# Patient Record
Sex: Male | Born: 2000
Health system: Southern US, Community
[De-identification: ages and names within clinical notes are randomized; demographics above are authoritative.]

## PROBLEM LIST (undated history)

## (undated) DIAGNOSIS — F419 Anxiety disorder, unspecified: Secondary | ICD-10-CM

## (undated) HISTORY — PX: TYMPANOSTOMY TUBE PLACEMENT: SHX32

## (undated) HISTORY — DX: Anxiety disorder, unspecified: F41.9

## (undated) HISTORY — PX: OTHER SURGICAL HISTORY: SHX169

---
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
## 2021-01-21 ENCOUNTER — Encounter: Payer: Self-pay | Admitting: Pediatrics

## 2021-01-21 ENCOUNTER — Ambulatory Visit (INDEPENDENT_AMBULATORY_CARE_PROVIDER_SITE_OTHER): Payer: 59 | Admitting: Pediatrics

## 2021-01-21 ENCOUNTER — Other Ambulatory Visit: Payer: Self-pay

## 2021-01-21 VITALS — BP 114/74 | Ht 70.5 in | Wt 222.7 lb

## 2021-01-21 DIAGNOSIS — Z68.41 Body mass index (BMI) pediatric, 5th percentile to less than 85th percentile for age: Secondary | ICD-10-CM | POA: Diagnosis not present

## 2021-01-21 DIAGNOSIS — R200 Anesthesia of skin: Secondary | ICD-10-CM

## 2021-01-21 DIAGNOSIS — Z23 Encounter for immunization: Secondary | ICD-10-CM | POA: Diagnosis not present

## 2021-01-21 DIAGNOSIS — Z0001 Encounter for general adult medical examination with abnormal findings: Secondary | ICD-10-CM

## 2021-01-21 DIAGNOSIS — R202 Paresthesia of skin: Secondary | ICD-10-CM

## 2021-01-21 DIAGNOSIS — G629 Polyneuropathy, unspecified: Secondary | ICD-10-CM | POA: Diagnosis not present

## 2021-01-21 DIAGNOSIS — Z00129 Encounter for routine child health examination without abnormal findings: Secondary | ICD-10-CM

## 2021-01-21 NOTE — Progress Notes (Signed)
Adolescent Well Care Visit Shane Cross is a 20 y.o. male who is here for well care.    PCP:  Georgiann Hahn, MD   History was provided by the patient and father.  Confidentiality was discussed with the patient and, if applicable, with caregiver as well.    Current Issues: Current concerns include none---has appointment with neurology for evaluation of numbness to legs--labs were normal..   Nutrition: Nutrition/Eating Behaviors: good Adequate calcium in diet?: yes Supplements/ Vitamins: n/a  Exercise/ Media: Play any Sports?/ Exercise: yes Screen Time:  < 2 hours Media Rules or Monitoring?: yes  Sleep:  Sleep: good  Social Screening: Lives with:  parents Parental relations:  good Activities, Work, and Regulatory affairs officer?: yes Concerns regarding behavior with peers?  no Stressors of note: no   Confidential Social History: Tobacco?  no Secondhand smoke exposure?  no Drugs/ETOH?  no  Sexually Active?  no   Pregnancy Prevention: n/a  Safe at home, in school & in relationships?  Yes Safe to self?  Yes   Screenings: Patient has a dental home: yes  The following topics were discussed: healthy eating, exercise, seatbelt use, bullying, abuse/trauma, weapon use, tobacco use, marijuana use, drug use, condom use, birth control, sexuality, suicidality/self harm, mental health issues, social isolation, school problems, family problems, and screen time   In addition, the following topics were discussed as part of anticipatory guidance   PHQ-9 completed and results indicated no risk  Physical Exam:  Vitals:   01/21/21 1030  BP: 114/74  Weight: 222 lb 11.2 oz (101 kg)  Height: 5' 10.5" (1.791 m)   BP 114/74   Ht 5' 10.5" (1.791 m)   Wt 222 lb 11.2 oz (101 kg)   BMI 31.50 kg/m  Body mass index: body mass index is 31.5 kg/m. Blood pressure percentiles are not available for patients who are 18 years or older.  Hearing Screening   500Hz  1000Hz  2000Hz  3000Hz  4000Hz   Right ear  25 25 25 25 25   Left ear 20 20 20 20 20    Vision Screening   Right eye Left eye Both eyes  Without correction 10/10 10/10   With correction       General Appearance:   alert, oriented, no acute distress and well nourished  HENT: Normocephalic, no obvious abnormality, conjunctiva clear  Mouth:   Normal appearing teeth, no obvious discoloration, dental caries, or dental caps  Neck:   Supple; thyroid: no enlargement, symmetric, no tenderness/mass/nodules  Chest normal  Lungs:   Clear to auscultation bilaterally, normal work of breathing  Heart:   Regular rate and rhythm, S1 and S2 normal, no murmurs;   Abdomen:   Soft, non-tender, no mass, or organomegaly  GU normal male genitals, no testicular masses or hernia  Musculoskeletal:   Tone and strength strong and symmetrical, all extremities               Lymphatic:   No cervical adenopathy  Skin/Hair/Nails:   Skin warm, dry and intact, no rashes, no bruises or petechiae  Neurologic:   Strength, gait, and coordination normal and age-appropriate     Assessment and Plan:   Well physical exam  BMI is appropriate for age  Hearing screening result:normal Vision screening result: normal  Counseling provided for all of the vaccine components  Orders Placed This Encounter  Procedures   Flu Vaccine QUAD 6+ mos PF IM (Fluarix Quad PF)     Return in about 1 year (around 01/21/2022).  , MD

## 2021-01-21 NOTE — Patient Instructions (Signed)
Preventive Care 18-19 Years Old, Male Preventive care refers to lifestyle choices and visits with your health care provider that can promote health and wellness. At this stage in your life, you may start seeing a primary care physician instead of a pediatrician. It is important to take responsibility for your health and well-being. Preventive care for young adults includes: A yearly physical exam. This is also called an annual wellness visit. Regular dental and eye exams. Immunizations. Screening for certain conditions. Healthy lifestyle choices, such as: Eating a healthy diet. Getting regular exercise. Not using drugs or products that contain nicotine and tobacco. Limiting alcohol use. What can I expect for my preventive care visit? Physical exam Your health care provider may check your: Height and weight. These may be used to calculate your BMI (body mass index). BMI is a measurement that tells if you are at a healthy weight. Heart rate and blood pressure. Body temperature. Skin for abnormal spots. Counseling Your health care provider may ask you questions about your: Past medical problems. Family's medical history. Alcohol, tobacco, and drug use. Home life and relationship well-being. Access to firearms. Emotional well-being. Diet, exercise, and sleep habits. Sexual activity and sexual health. What immunizations do I need? Vaccines are usually given at various ages, according to a schedule. Your health care provider will recommend vaccines for you based on your age, medical history, and lifestyle or other factors, such as travel or where you work. What tests do I need? Blood tests Lipid and cholesterol levels. These may be checked every 5 years starting at age 20. Hepatitis C test. Hepatitis B test. Screening Genital exam to check for testicular cancer or hernias. STD (sexually transmitted disease) testing, if you are at risk. Other tests Tuberculosis skin test. Vision and  hearing tests. Skin exam. Talk with your health care provider about your test results, treatment options, and if necessary, the need for more tests. Follow these instructions at home: Eating and drinking  Eat a healthy diet that includes fresh fruits and vegetables, whole grains, lean protein, and low-fat dairy products. Drink enough fluid to keep your urine pale yellow. Do not drink alcohol if: Your health care provider tells you not to drink. You are under the legal drinking age. In the [place] the legal drinking age is 21. If you drink alcohol: Limit how much you use to 0-2 drinks a day. Be aware of how much alcohol is in your drink. In the [place] one drink equals one 12 oz bottle of beer (355 mL), one 5 oz glass of wine (148 mL), or one 1 oz glass of hard liquor (44 mL). Lifestyle Take daily care of your teeth and gums. Brush your teeth every morning and night with fluoride toothpaste. Floss one time each day. Stay active. Exercise for at least 30 minutes 5 or more days of the week. Do not use any products that contain nicotine or tobacco, such as cigarettes, e-cigarettes, and chewing tobacco. If you need help quitting, ask your health care provider. Do not use drugs. If you are sexually active, practice safe sex. Use a condom or other form of protection to prevent STIs (sexually transmitted infections). Find healthy ways to cope with stress, such as: Meditation, yoga, or listening to music. Journaling. Talking to a trusted person. Spending time with friends and family. Safety Always wear your seat belt while driving or riding in a vehicle. Do not drive: If you have been drinking alcohol. Do not ride with someone who has been drinking.   When you are tired or distracted. While texting. Wear a helmet and other protective equipment during sports activities. If you have firearms in your house, make sure you follow all gun safety procedures. Seek help if you have been bullied, physically  abused, or sexually abused. Use the Internet responsibly to avoid dangers, such as online bullying and online sex predators. What's next? Go to your health care provider once a year for an annual wellness visit. Ask your health care provider how often you should have your eyes and teeth checked. Stay up to date on all vaccines. This information is not intended to replace advice given to you by your health care provider. Make sure you discuss any questions you have with your health care provider. Document Revised: 06/28/2020 Document Reviewed: 04/14/2018 Elsevier Patient Education  2022 Elsevier Inc.  

## 2021-02-13 ENCOUNTER — Telehealth: Payer: Self-pay | Admitting: Pediatrics

## 2021-02-13 DIAGNOSIS — R079 Chest pain, unspecified: Secondary | ICD-10-CM

## 2021-02-13 NOTE — Telephone Encounter (Signed)
Chest X ray ordered and will see him tomorrow with results

## 2021-02-13 NOTE — Telephone Encounter (Signed)
Father called stating patient started have left side chest pains yesterday and today both sides hurt. Patient is not having trouble breathing, no fever, no cough or congestion present. Patient complains when he takes a deep breath or sitting or moving around. Per Dr. Barney Drain would like patient go to get an chest x-ray tomorrow at [facility] [place] [facility] and then come to the office afterwards to be seen. Dad agrees with plan.

## 2021-02-14 ENCOUNTER — Ambulatory Visit (INDEPENDENT_AMBULATORY_CARE_PROVIDER_SITE_OTHER): Payer: 59 | Admitting: Pediatrics

## 2021-02-14 ENCOUNTER — Ambulatory Visit
Admission: RE | Admit: 2021-02-14 | Discharge: 2021-02-14 | Disposition: A | Discharge status: Home / Self Care | Payer: 59 | Source: Ambulatory Visit | Attending: Pediatrics | Admitting: Pediatrics

## 2021-02-14 ENCOUNTER — Other Ambulatory Visit: Payer: Self-pay

## 2021-02-14 VITALS — BP 124/84 | HR 96 | Wt 229.9 lb

## 2021-02-14 DIAGNOSIS — R079 Chest pain, unspecified: Secondary | ICD-10-CM

## 2021-02-14 DIAGNOSIS — R0789 Other chest pain: Secondary | ICD-10-CM

## 2021-02-14 MED ORDER — TRIAMCINOLONE ACETONIDE 0.025 % EX OINT: 1.0000 "application " | TOPICAL_OINTMENT | Freq: Two times a day (BID) | CUTANEOUS | 3 refills | Status: AC

## 2021-02-14 NOTE — Patient Instructions (Signed)
Costochondritis Costochondritis is irritation and swelling (inflammation) of the tissue that connects the ribs to the breastbone (sternum). This tissue is called cartilage. Costochondritis causes pain in the front of the chest. Usually, the pain: Starts slowly. Is in more than one rib. What are the causes? The exact cause of this condition is not always known. It results from stress on the tissue in the affected area. The cause of this stress could be: Chest injury. Exercise or activity, such as lifting. Very bad coughing. What increases the risk? You are more likely to develop this condition if you: Are male. Are 30-40 years old. Recently started a new exercise or work activity. Have low levels of vitamin D. Have a condition that makes you cough often. What are the signs or symptoms? The main symptom of this condition is chest pain. The pain: Usually starts slowly and can be sharp or dull. Gets worse with deep breathing, coughing, or exercise. Gets better with rest. May be worse when you press on the affected area of your ribs and breastbone. How is this treated? This condition usually goes away on its own over time. Your doctor may prescribe an NSAID, such as ibuprofen. This can help reduce pain and inflammation. Treatment may also include: Resting and avoiding activities that make pain worse. Putting heat or ice on the painful area. Doing exercises to stretch your chest muscles. If these treatments do not help, your doctor may inject a numbing medicine to help relieve the pain. Follow these instructions at home: Managing pain, stiffness, and swelling   If told, put ice on the painful area. To do this: Put ice in a plastic bag. Place a towel between your skin and the bag. Leave the ice on for 20 minutes, 2-3 times a day. If told, put heat on the affected area. Do this as often as told by your doctor. Use the heat source that your doctor recommends, such as a moist heat pack or  a heating pad. Place a towel between your skin and the heat source. Leave the heat on for 20-30 minutes. Take off the heat if your skin turns bright red. This is very important if you cannot feel pain, heat, or cold. You may have a greater risk of getting burned. Activity Rest as told by your doctor. Do not do anything that makes your pain worse. This includes any activities that use chest, belly (abdomen), and side muscles. Do not lift anything that is heavier than 10 lb (4.5 kg), or the limit that you are told, until your doctor says that it is safe. Return to your normal activities as told by your doctor. Ask your doctor what activities are safe for you. General instructions Take over-the-counter and prescription medicines only as told by your doctor. Keep all follow-up visits as told by your doctor. This is important. Contact a doctor if: You have chills or a fever. Your pain does not go away or it gets worse. You have a cough that does not go away. Get help right away if: You are short of breath. You have very bad chest pain that is not helped by medicines, heat, or ice. These symptoms may be an emergency. Do not wait to see if the symptoms will go away. Get medical help right away. Call your local emergency services (911 in the [place] Do not drive yourself to the hospital. Summary Costochondritis is irritation and swelling (inflammation) of the tissue that connects the ribs to the breastbone (sternum). This condition   causes pain in the front of the chest. Treatment may include medicines, rest, heat or ice, and exercises. This information is not intended to replace advice given to you by your health care provider. Make sure you discuss any questions you have with your health care provider. Document Revised: 03/03/2019 Document Reviewed: 03/03/2019 Elsevier Patient Education  2022 Elsevier Inc.  

## 2021-02-17 ENCOUNTER — Encounter: Payer: Self-pay | Admitting: Pediatrics

## 2021-02-17 DIAGNOSIS — R0789 Other chest pain: Secondary | ICD-10-CM | POA: Insufficient documentation

## 2021-02-17 NOTE — Progress Notes (Signed)
This is a 20 y.o. male who presents for evaluation of chest wall pain. Onset was 1 week ago. Symptoms have been stable since that time. The patient describes the pain as aching in the entire chest. Patient rates pain as a 3/10 in intensity. Associated symptoms are: none. Aggravating factors are: deep inspiration, exercise, lifting, palpation of chest. Alleviating factors are: cold and rest. Mechanism of injury: may have been injured by lifting weights. Previous visits for this problem: none. Evaluation to date: none. Treatment to date: OTC analgesics: somewhat effective.  The following portions of the patient's history were reviewed and updated as appropriate: allergies, current medications, past family history, past medical history, past social history, past surgical history and problem list.  Review of Systems Pertinent items are noted in HPI.    Objective:   BP 124/84  Wt 229 lb 14.4 oz  General appearance: alert and cooperative Nose: Nares normal. Septum midline. Mucosa normal. No drainage or sinus tenderness. Throat: lips, mucosa, and tongue normal; teeth and gums normal Neck: no adenopathy, supple, symmetrical, trachea midline and thyroid not enlarged, symmetric, no tenderness/mass/nodules Lungs: clear to auscultation bilaterally Chest wall: no tenderness, right sided costochondral tenderness, left sided costochondral tenderness Heart: regular rate and rhythm, S1, S2 normal, no murmur, click, rub or gallop Extremities: extremities normal, atraumatic, no cyanosis or edema Skin: Skin color, texture, turgor normal. No rashes or lesions Neurologic: Grossly normal  Chest X ray done this morning --negative  Assessment:    Costochondritis   Plan:    Chest wall injuries were discussed.  The sometimes prolonged recovery time was stressed. OTC analgesics as needed. Prescription NSAIDs per medication orders. Follow up as needed

## 2021-03-13 ENCOUNTER — Ambulatory Visit (INDEPENDENT_AMBULATORY_CARE_PROVIDER_SITE_OTHER): Payer: 59 | Admitting: Neurology

## 2021-03-13 ENCOUNTER — Encounter: Payer: Self-pay | Admitting: Neurology

## 2021-03-13 VITALS — BP 119/71 | HR 87 | Ht 71.0 in | Wt 226.8 lb

## 2021-03-13 DIAGNOSIS — R200 Anesthesia of skin: Secondary | ICD-10-CM | POA: Diagnosis not present

## 2021-03-13 DIAGNOSIS — G5711 Meralgia paresthetica, right lower limb: Secondary | ICD-10-CM

## 2021-03-13 NOTE — Progress Notes (Signed)
[facility]    Provider:  Dr Lucia Gaskins Requesting Provider: Georgiann Hahn, MD Primary Care Provider:  Georgiann Hahn, MD  CC:  right thigh intermittent numbness  HPI:  Shane Cross is a 20 y.o. male here as requested by Georgiann Hahn, MD for intermittent numbness to right thigh. PMHx anxiety, vertigo seen by Dr. Dorma Russell in the past (possibly Mnire's disease), chronic rhinitis, costochondritis/chest wall pain. I reviewed Dr. Neville Route notes: Patient presented for evaluation of intermittent numbness to the right thigh.  Its been on and off for the past 3 weeks prior to her appointment which was in August of this year, she reported no precipitating event, no fever, no tingling in the hands or feet.  Patient's BMI a little over 30, obese with a weight of 220.  Otherwise physical examination including eyes ears nose neck back lungs heart and neurologic were normal.  Here with father who also provides information. Started about 6 months ago, in June, on and off, numbness antero-lateral right thigh, some tingling, not painful, mildly uncomfortable, no back pain, no radiation. No patterns noticed. Random. Lasts 20 minutes or less. Changing positions makes it go away. Standing too long may make it noticeable. It happened when he was in [employer] but no inciting events, no weakness, no problems walking, doesn't affect his exercise, just feels weird. No other focal neurologic deficits, associated symptoms, inciting events or modifiable factors.  Reviewed notes, labs and imaging from outside physicians, which showed   Hgba1c normal 12/2020 5.1  Review of Systems: Patient complains of symptoms per HPI as well as the following symptoms thigh numbness. Pertinent negatives and positives per HPI. All others negative.   Social History   Socioeconomic History   Marital status: Single    Spouse name: Not on file   Number of children: Not on file   Years of education: Not on file    Highest education level: Not on file  Occupational History   Not on file  Tobacco Use   Smoking status: Never   Smokeless tobacco: Never  Substance and Sexual Activity   Alcohol use: No   Drug use: No   Sexual activity: Not on file  Other Topics Concern   Not on file  Social History Narrative   Not on file   Social Determinants of Health   Financial Resource Strain: Not on file  Food Insecurity: Not on file  Transportation Needs: Not on file  Physical Activity: Not on file  Stress: Not on file  Social Connections: Not on file  Intimate Partner Violence: Not on file    Family History  Problem Relation Age of Onset   Anxiety disorder Paternal Uncle    Anxiety disorder Paternal Grandmother    Heart disease Paternal Grandmother    Hyperlipidemia Paternal Grandmother    Anxiety disorder Paternal Grandfather    Alcohol abuse Neg Hx    Arthritis Neg Hx    Asthma Neg Hx    Birth defects Neg Hx    COPD Neg Hx    Depression Neg Hx    Diabetes Neg Hx    Drug abuse Neg Hx    Early death Neg Hx    Hearing loss Neg Hx    Hypertension Neg Hx    Kidney disease Neg Hx    Learning disabilities Neg Hx    Mental illness Neg Hx    Mental retardation Neg Hx    Miscarriages / Stillbirths Neg Hx    Stroke Neg Hx  Vision loss Neg Hx    Varicose Veins Neg Hx    Cancer Mother        Breast-->10 yrs    Past Medical History:  Diagnosis Date   Anxiety    Phreesia 12/01/2019    Patient Active Problem List   Diagnosis Date Noted   Costochondral chest pain 02/17/2021   Encounter for routine child health examination without abnormal findings 01/21/2021   BMI (body mass index), pediatric, 5% to less than 85% for age 18/22/2017    Past Surgical History:  Procedure Laterality Date   tubes in his ears     TYMPANOSTOMY TUBE PLACEMENT      Current Outpatient Medications  Medication Sig Dispense Refill   azelastine (ASTELIN) 0.1 % nasal spray Place into the nose.      cetirizine (ZYRTEC) 1 MG/ML syrup Take 5 mg by mouth daily as needed. For allergies     chlorthalidone (HYGROTON) 50 MG tablet Take 50 mg by mouth daily.     guaiFENesin (MUCINEX PO) Take 600 tablets by mouth daily.     Pediatric Multivit-Minerals-C (CHILDRENS GUMMIES PO) Take 2 tablets by mouth daily.       Vitamins-Lipotropics (LIPO-FLAVONOID PLUS PO) Take by mouth daily.     Current Facility-Administered Medications  Medication Dose Route Frequency Provider Last Rate Last Admin   acetaminophen (TYLENOL) solution 320 mg  320 mg Oral Once Roni Bread A, MD        Allergies as of 03/13/2021   (No Known Allergies)    Vitals: BP 119/71   Pulse 87   Ht 5\' 11"  (1.803 m)   Wt 226 lb 12.8 oz (102.9 kg)   BMI 31.63 kg/m  Last Weight:  Wt Readings from Last 1 Encounters:  03/13/21 226 lb 12.8 oz (102.9 kg) (98 %, Z= 2.02)*   * Growth percentiles are based on CDC (Boys, 2-20 Years) data.   Last Height:   Ht Readings from Last 1 Encounters:  03/13/21 5\' 11"  (1.803 m) (70 %, Z= 0.51)*   * Growth percentiles are based on CDC (Boys, 2-20 Years) data.     Physical exam: Exam: Gen: NAD, conversant, well nourised, obese, well groomed                     CV: RRR, no MRG. No Carotid Bruits. No peripheral edema, warm, nontender Eyes: Conjunctivae clear without exudates or hemorrhage Abd/pelvis: no pain on palpation in the abdomen or pelvis or at the inginal ligament, no rashes  Neuro: Detailed Neurologic Exam  Speech:    Speech is normal; fluent and spontaneous with normal comprehension.  Cognition:    The patient is oriented to person, place, and time;     recent and remote memory intact;     language fluent;     normal attention, concentration,     fund of knowledge Cranial Nerves:    The pupils are equal, round, and reactive to light. The fundi are flatVisual fields are full to finger confrontation. Extraocular movements are intact. Trigeminal sensation is intact and the  muscles of mastication are normal. The face is symmetric. The palate elevates in the midline. Hearing intact. Voice is normal. Shoulder shrug is normal. The tongue has normal motion without fasciculations.   Coordination:    Normal    Gait: normal.   Motor Observation:    No asymmetry, no atrophy, and no involuntary movements noted. Tone:    Normal muscle tone.    Posture:  Posture is normal. normal erect    Strength:    Strength is V/V in the upper and lower limbs.      Sensation: intact to LT     Reflex Exam:  DTR's:    Deep tendon reflexes in the upper and lower extremities are normal bilaterally.   Toes:    The toes are downgoing bilaterally.   Clonus:    Clonus is absent.    Assessment/Plan:  Young patient likely Meralgia Paresthetica. But if symptoms worsen or he has new symptoms please contact us as soon as possible.  Recommend physical therapy for Meralgia Paresthetica (Lateral Femoral Cutaneous Neuropathy) Can also send for a nerve block if it worsens(Dr. Jordan Likes) or you have new symptoms, contact me  Orders Placed This Encounter  Procedures   Ambulatory referral to Physical Therapy      Cc: Georgiann Hahn, MD,  Georgiann Hahn, MD  Naomie Dean, MD  [facility] [address] [place], [place] [address]  Phone [phone] Fax [phone]

## 2021-03-13 NOTE — Patient Instructions (Addendum)
Recommend physical therapy for Meralgia Paresthetica (Lateral Femoral Cutaneous Neuropathy) Can also send for a nerve block if it worsens or you have new symptoms, contact me   Meralgia paresthetica Overview Meralgia paresthetica (also known as lateral femoral cutaneous nerve entrapment) is a condition characterized by tingling, numbness and burning pain in your outer thigh. It's caused by compression of the nerve that provides sensation to the skin covering your thigh.  Tight clothing, obesity or weight gain, and pregnancy are common causes of meralgia paresthetica. However, meralgia paresthetica can also be due to local trauma or a disease, such as diabetes.  In most cases, you can relieve meralgia paresthetica with conservative measures, such as wearing looser clothing. In severe cases, treatment may include medications to relieve discomfort or, rarely, surgery.  Symptoms Meralgia paresthetica may cause these symptoms affecting the outer (lateral) part of your thigh:  Tingling and numbness Burning pain Decreased sensation Increased sensitivity and pain to even a light touch These symptoms commonly occur on one side of your body and might intensify after walking or standing.  When to see your doctor See your doctor if you have symptoms of meralgia paresthetica.  Causes Meralgia paresthetica occurs when the lateral femoral cutaneous nerve -- which supplies sensation to the surface of your outer thigh -- is pinched (compressed). The lateral femoral cutaneous nerve only affects sensation and doesn't affect your ability to use your leg muscles.  In most people, this nerve passes through the groin to the upper thigh without trouble. But in meralgia paresthetica, the lateral femoral cutaneous nerve becomes trapped -- often under the inguinal ligament, which runs along your groin from your abdomen to your upper thigh.  Common causes of this compression include any condition that increases  pressure on the groin, including:  Tight clothing, such as belts, corsets and tight pants Obesity or weight gain Wearing a heavy tool belt Pregnancy Fluid accumulation in the abdomen causing increased abdominal pressure Scar tissue near the inguinal ligament due to injury or past surgery Nerve injury, which can be due to diabetes, trauma after surgery or seat belt injury after a motor vehicle accident, for example, also can cause meralgia paresthetica.  Risk factors The following might increase your risk of meralgia paresthetica:  Extra weight. Being overweight or obese can increase the pressure on your lateral femoral cutaneous nerve. Pregnancy. A growing belly puts added pressure on your groin, through which the lateral femoral cutaneous nerve passes. Diabetes. Diabetes-related nerve injury can lead to meralgia paresthetica. Age. People between the ages of 74 and 11 are at a higher risk. By [facility] Staff  Lateral Femoral Cutaneous Nerve Block Patient Information  Description: The lateral femoral cutaneous nerve of the thigh is a purely sensory nerve that can become entrapped or irritated for a number of reasons.  The pain associated with this condition is called meralgia paraesthetica.  Patients affected with this syndrome have burning pain or abnormal sensation along the lateral aspect of the thigh.  The pain can be worsened by prolonged walking, standing, or constrictive garments around the house.   The diagnosis can be confirmed and treatment initiated by blocking the nerve with local anesthetic (like Novocaine).  At times, a steroid solution may be injected at the same time.  The site of injection is through a tiny needle in the left, lower quadrant of the abdomen.   The entire block usually lasts less than 5 minutes.  Conditions which may be treated by lateral femoral cutaneous nerve block:  Meralagia paraesthetica

## 2021-03-17 ENCOUNTER — Telehealth: Payer: Self-pay | Admitting: Neurology

## 2021-03-17 NOTE — Telephone Encounter (Signed)
Referral sent to [facility]. Phone: [phone].  

## 2021-05-19 ENCOUNTER — Institutional Professional Consult (permissible substitution): Payer: 59 | Admitting: Pediatrics

## 2021-08-31 ENCOUNTER — Other Ambulatory Visit: Payer: Self-pay | Admitting: Internal Medicine

## 2021-09-16 ENCOUNTER — Other Ambulatory Visit: Payer: Self-pay | Admitting: Internal Medicine

## 2021-12-15 ENCOUNTER — Encounter: Payer: Self-pay | Admitting: Pediatrics

## 2022-01-21 ENCOUNTER — Other Ambulatory Visit: Payer: Self-pay | Admitting: Internal Medicine

## 2022-08-26 IMAGING — CR DG CHEST 2V
2 series · 2 of 2 positions shown · non-contrast
Comparison: None.

CLINICAL DATA: Chest pain

EXAM:
CHEST - 2 VIEW

[w chest pa]
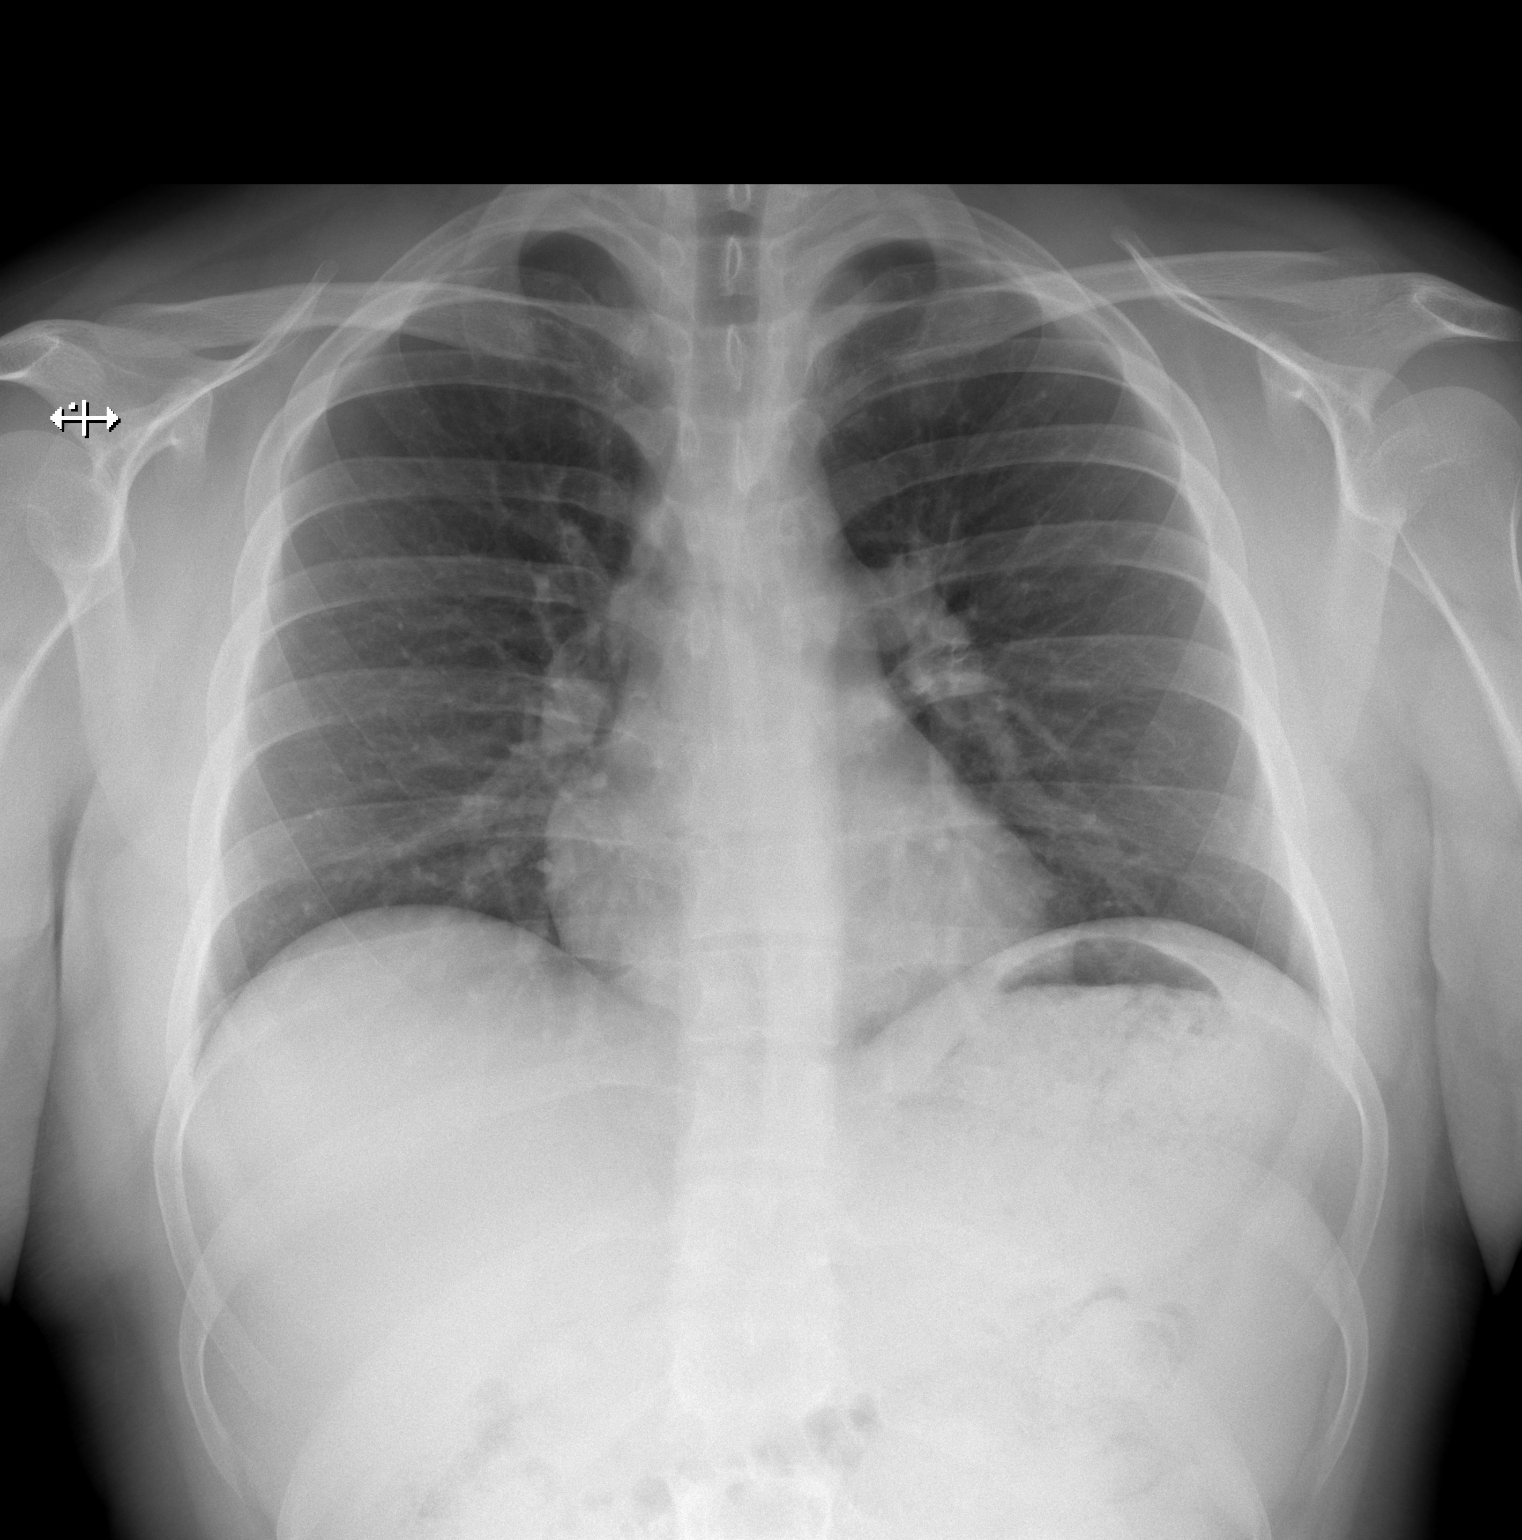

[w chest lat]
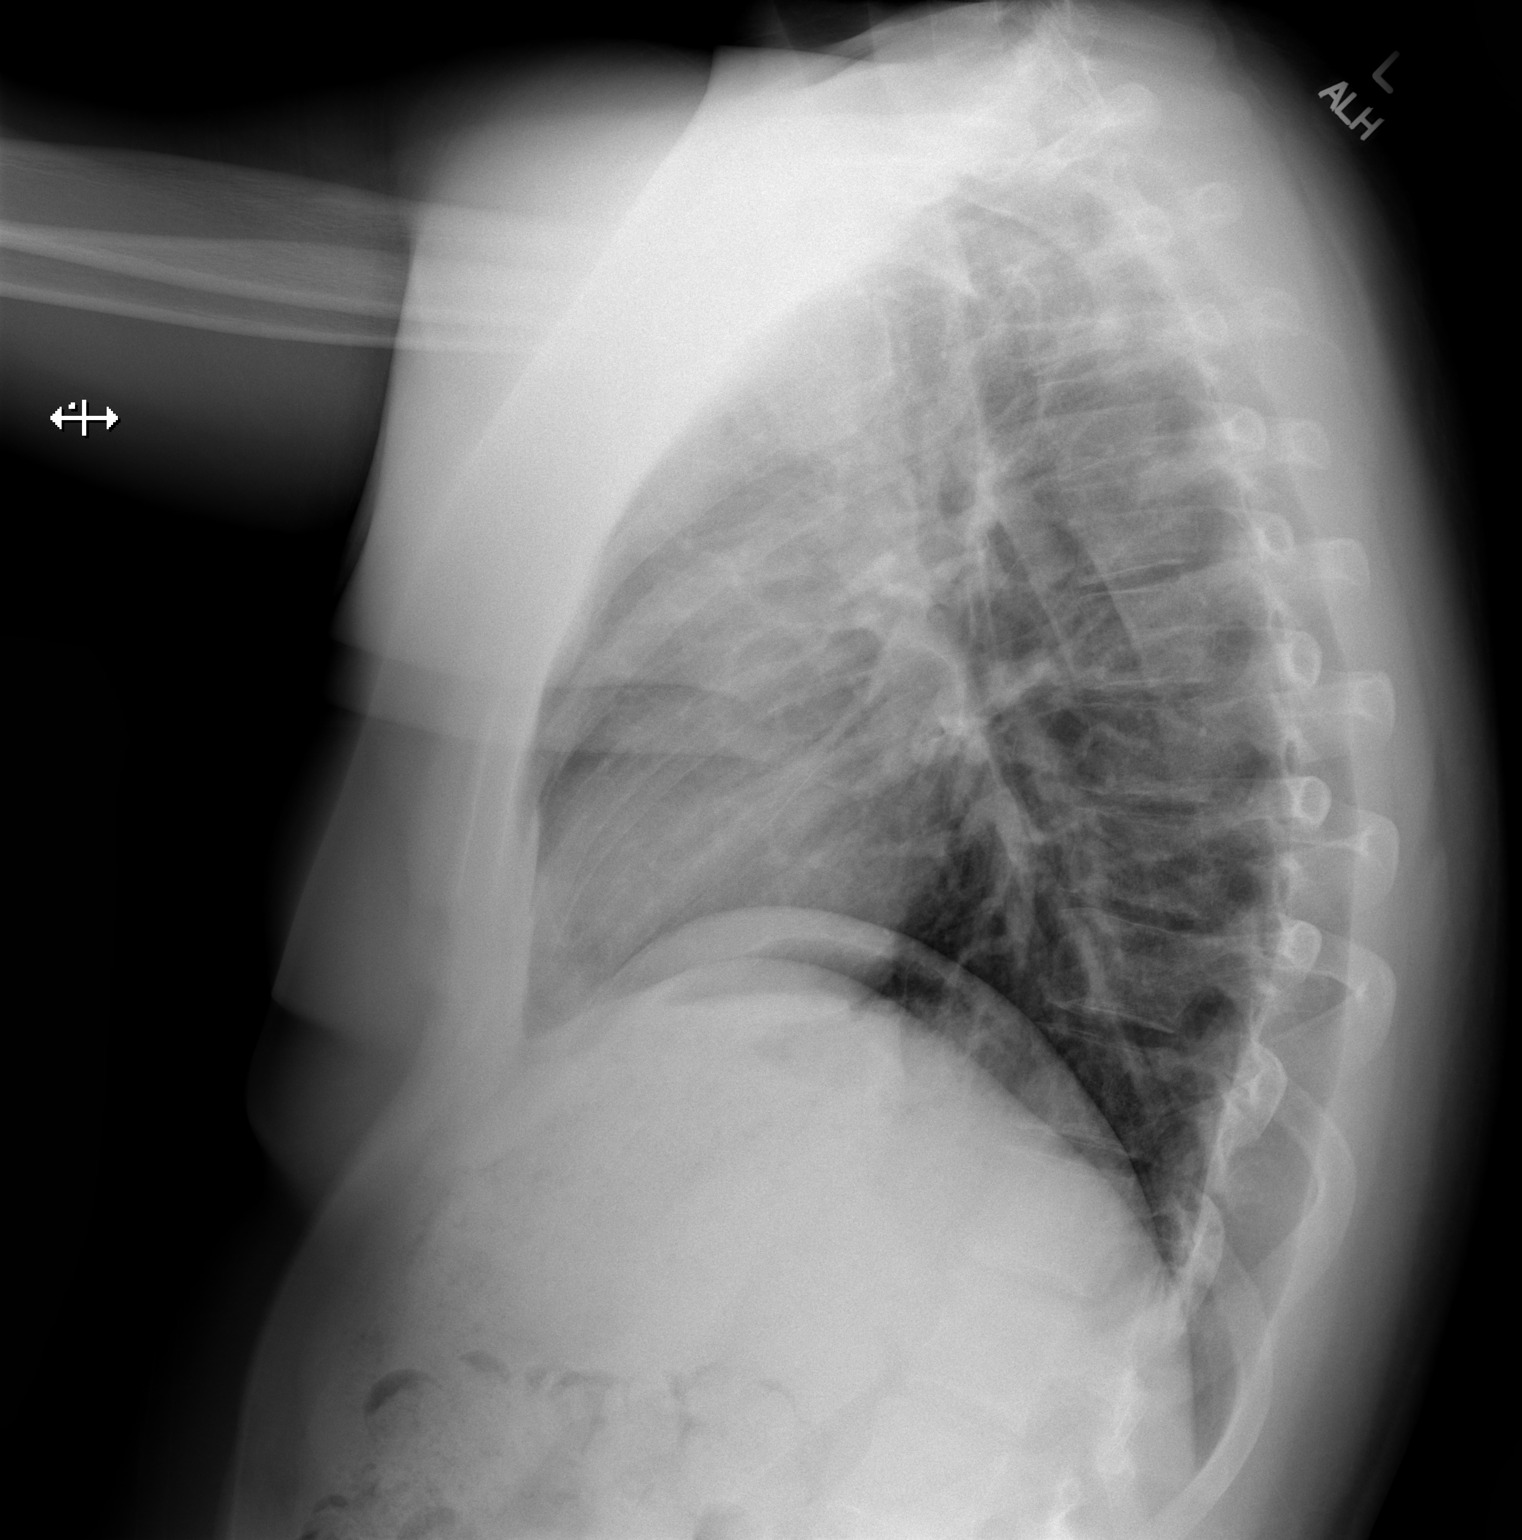

[2 of 2 positions shown; findings below may reference images not displayed]

FINDINGS: The heart size and mediastinal contours are within normal limits.
Both lungs are clear. The visualized skeletal structures are
unremarkable.
IMPRESSION: No evidence of acute cardiopulmonary disease.

## 2023-01-12 ENCOUNTER — Encounter: Payer: Self-pay | Admitting: Pediatrics
